# Patient Record
Sex: Male | Born: 1995
Health system: Southern US, Community
[De-identification: ages and names within clinical notes are randomized; demographics above are authoritative.]

## PROBLEM LIST (undated history)

## (undated) DIAGNOSIS — R238 Other skin changes: Secondary | ICD-10-CM

## (undated) DIAGNOSIS — M352 Behcet's disease: Principal | ICD-10-CM

## (undated) DIAGNOSIS — N5089 Other specified disorders of the male genital organs: Secondary | ICD-10-CM

## (undated) DIAGNOSIS — Z8719 Personal history of other diseases of the digestive system: Secondary | ICD-10-CM

## (undated) DIAGNOSIS — B009 Herpesviral infection, unspecified: Secondary | ICD-10-CM

## (undated) DIAGNOSIS — F329 Major depressive disorder, single episode, unspecified: Secondary | ICD-10-CM

## (undated) DIAGNOSIS — F32A Depression, unspecified: Secondary | ICD-10-CM

## (undated) DIAGNOSIS — R21 Rash and other nonspecific skin eruption: Secondary | ICD-10-CM

## (undated) DIAGNOSIS — R599 Enlarged lymph nodes, unspecified: Secondary | ICD-10-CM

## (undated) DIAGNOSIS — F909 Attention-deficit hyperactivity disorder, unspecified type: Secondary | ICD-10-CM

## (undated) HISTORY — DX: Personal history of other diseases of the digestive system: Z87.19

## (undated) HISTORY — DX: Herpesviral infection, unspecified: B00.9

## (undated) HISTORY — DX: Behcet's disease: M35.2

## (undated) HISTORY — DX: Depression, unspecified: F32.A

## (undated) HISTORY — DX: Other specified disorders of the male genital organs: N50.89

## (undated) HISTORY — DX: Other skin changes: R23.8

## (undated) HISTORY — DX: Rash and other nonspecific skin eruption: R21

## (undated) HISTORY — DX: Major depressive disorder, single episode, unspecified: F32.9

## (undated) HISTORY — DX: Attention-deficit hyperactivity disorder, unspecified type: F90.9

---
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
## 2015-02-19 ENCOUNTER — Telehealth: Payer: Self-pay

## 2015-02-19 NOTE — Telephone Encounter (Signed)
Waiting on payment of $10 for 12 pages.

## 2015-03-05 NOTE — Telephone Encounter (Signed)
[facility] had not [facility] our invoice for this patient, so I re-faxed the letter and will wait on payment before sending records.

## 2015-03-19 NOTE — Telephone Encounter (Signed)
refaxed letter for payment  03/19/15 °

## 2015-03-29 NOTE — Telephone Encounter (Signed)
Called and left a message requesting payment and asking if they still needed the records, will wait on call back before moving forward with the request.

## 2015-04-16 NOTE — Telephone Encounter (Signed)
[facility] they stated that they had settled with the patient and no longer need the records, so the release was cancelled.

## 2015-08-11 ENCOUNTER — Telehealth: Payer: Self-pay | Admitting: Family Medicine

## 2015-08-11 MED ORDER — VALACYCLOVIR HCL 1 G PO TABS: 1000.0000 mg | ORAL_TABLET | Freq: Three times a day (TID) | ORAL | Status: DC

## 2015-08-11 NOTE — Telephone Encounter (Signed)
Mother called answering service; patient with two day history of recurrent HSV-2 vesicular rash on forearm in same location as rash in 2015.  No refills on Valtrex.  [facility] Day weekend and office closed for the next 48 hours; mother requesting refill due to holiday weekend.  A/P: HSV2: [facility]

## 2015-08-12 ENCOUNTER — Encounter (HOSPITAL_COMMUNITY): Payer: Self-pay | Admitting: Nurse Practitioner

## 2015-08-12 ENCOUNTER — Emergency Department (HOSPITAL_COMMUNITY)
Admission: EM | Admit: 2015-08-12 | Discharge: 2015-08-12 | Disposition: A | Discharge status: Home / Self Care | Payer: Commercial Managed Care - HMO | Attending: Emergency Medicine | Admitting: Emergency Medicine

## 2015-08-12 DIAGNOSIS — R21 Rash and other nonspecific skin eruption: Secondary | ICD-10-CM

## 2015-08-12 DIAGNOSIS — Z8659 Personal history of other mental and behavioral disorders: Secondary | ICD-10-CM | POA: Insufficient documentation

## 2015-08-12 DIAGNOSIS — B009 Herpesviral infection, unspecified: Secondary | ICD-10-CM | POA: Insufficient documentation

## 2015-08-12 LAB — RAPID HIV SCREEN (HIV 1/2 AB+AG)
HIV 1/2 ANTIBODIES: NONREACTIVE
HIV-1 P24 ANTIGEN - HIV24: NONREACTIVE

## 2015-08-12 MED ORDER — IBUPROFEN 800 MG PO TABS
800.0000 mg | ORAL_TABLET | Freq: Once | ORAL | Status: AC
  Administered 2015-08-12: 800 mg via ORAL
  Filled 2015-08-12: qty 1

## 2015-08-12 MED ORDER — VALACYCLOVIR HCL 1 G PO TABS: 1000.0000 mg | ORAL_TABLET | Freq: Three times a day (TID) | ORAL | Status: DC

## 2015-08-12 MED ORDER — OXYCODONE-ACETAMINOPHEN 5-325 MG PO TABS: 1.0000 | ORAL_TABLET | ORAL | Status: DC | PRN

## 2015-08-12 MED ORDER — OXYCODONE-ACETAMINOPHEN 5-325 MG PO TABS
1.0000 | ORAL_TABLET | Freq: Once | ORAL | Status: AC
Start: 2015-08-12 — End: 2015-08-12
  Administered 2015-08-12: 1 via ORAL

## 2015-08-12 MED ORDER — IBUPROFEN 800 MG PO TABS: 800.0000 mg | ORAL_TABLET | Freq: Three times a day (TID) | ORAL | Status: DC | PRN

## 2015-08-12 MED ORDER — DIPHENHYDRAMINE HCL 25 MG PO CAPS
ORAL_CAPSULE | ORAL | Status: AC
  Filled 2015-08-12: qty 1

## 2015-08-12 MED ORDER — DIPHENHYDRAMINE HCL 25 MG PO CAPS
25.0000 mg | ORAL_CAPSULE | Freq: Once | ORAL | Status: AC
  Administered 2015-08-12: 25 mg via ORAL

## 2015-08-12 MED ORDER — OXYCODONE-ACETAMINOPHEN 5-325 MG PO TABS
ORAL_TABLET | ORAL | Status: AC
  Filled 2015-08-12: qty 1

## 2015-08-12 NOTE — ED Provider Notes (Signed)
CSN: [id number]     Arrival date & time 08/12/15  1114 History   First MD Initiated Contact with Patient 08/12/15 1213     Chief Complaint  Patient presents with  . Rash     (Consider location/radiation/quality/duration/timing/severity/associated sxs/prior Treatment) HPI Comments: 20 year old male with history of HSV skin infection in the past presents for rash. The patient reports that since Thursday he has developed a blisterlike rash over his left arm as well as his bilateral legs and his groin area. He reports he took some of his extra Valtrex that he had left over from his last infection by the rash seemed to get worse and he now has lesions on his lip. No lesions in his mouth. He denies fevers or chills. He said he has pain at the sites of the rash and no other symptoms at this time. No fever. He denies HIV or immunocompromise.    Patient is a 20 y.o. male presenting with rash.  Rash Associated symptoms: no abdominal pain, no diarrhea, no fatigue, no fever, no headaches, no myalgias, no nausea, no shortness of breath and not vomiting     Past Medical History  Diagnosis Date  . Lymph node enlargement   . Depression   . ADHD (attention deficit hyperactivity disorder)   . HSV-2 infection    History reviewed. No pertinent past surgical history. Family History  Problem Relation Age of Onset  . Diabetes Mother   . Hypertension Mother   . Cancer Other    Social History  Substance Use Topics  . Smoking status: Never Smoker   . Smokeless tobacco: None  . Alcohol Use: No    Review of Systems  Constitutional: Negative for fever, chills, appetite change and fatigue.  HENT: Negative for congestion, nosebleeds, postnasal drip, rhinorrhea and sinus pressure.   Eyes: Negative for visual disturbance.  Respiratory: Negative for cough, chest tightness and shortness of breath.   Cardiovascular: Negative for chest pain and palpitations.  Gastrointestinal: Negative for nausea, vomiting,  abdominal pain and diarrhea.  Genitourinary: Negative for dysuria, urgency and hematuria.  Musculoskeletal: Negative for myalgias and back pain.  Skin: Positive for rash.  Neurological: Negative for dizziness, weakness and headaches.  Hematological: Does not bruise/bleed easily.      Allergies  Review of patient's allergies indicates no known allergies.  Home Medications   Prior to Admission medications   Medication Sig Start Date End Date Taking? Authorizing Provider  cyclobenzaprine (FLEXERIL) 10 MG tablet Take 1 tablet (10 mg total) by mouth 3 (three) times daily as needed for muscle spasms. 12/08/14   Carmelina DaneJeffery S Anderson, MD  ibuprofen (ADVIL,MOTRIN) 800 MG tablet Take 1 tablet (800 mg total) by mouth every 8 (eight) hours as needed for moderate pain. 08/12/15   Leta BaptistEmily Roe Nguyen, MD  oxyCODONE-acetaminophen (PERCOCET/ROXICET) 5-325 MG tablet Take 1 tablet by mouth every 4 (four) hours as needed for moderate pain or severe pain. 08/12/15   Leta BaptistEmily Roe Nguyen, MD  valACYclovir (VALTREX) 1000 MG tablet Take 1 tablet (1,000 mg total) by mouth 3 (three) times daily. 08/12/15   Leta BaptistEmily Roe Nguyen, MD   BP 111/63 mmHg  Pulse 65  Temp(Src) 98.4 F (36.9 C) (Oral)  Resp 18  SpO2 100% Physical Exam  Constitutional: He is oriented to person, place, and time. He appears well-developed and well-nourished. No distress.  HENT:  Head: Normocephalic and atraumatic.  Right Ear: External ear normal.  Left Ear: External ear normal.  Mouth/Throat: Oropharynx is clear and moist.  No oropharyngeal exudate.  Eyes: EOM are normal. Pupils are equal, round, and reactive to light.  Neck: Normal range of motion. Neck supple.  Cardiovascular: Normal rate, regular rhythm, normal heart sounds and intact distal pulses.   No murmur heard. Pulmonary/Chest: Effort normal. No respiratory distress. He has no wheezes. He has no rales.  Abdominal: Soft. He exhibits no distension. There is no tenderness.  Musculoskeletal:  He exhibits no edema.  Neurological: He is alert and oriented to person, place, and time.  Skin: Skin is warm and dry. Rash noted. Rash is vesicular. He is not diaphoretic.     Vesicular lesions in the genital total area as well as a few scattered over the bilateral lower extremities. There were also vesicular lesions on the lips. No lesions on the inner mucosa of the mouth.  Vitals reviewed.   ED Course  Procedures (including critical care time) Labs Review Labs Reviewed  RAPID HIV SCREEN (HIV 1/2 AB+AG)  HERPES SIMPLEX VIRUS(HSV) DNA BY PCR    Imaging Review No results found. I have personally reviewed and evaluated these images and lab results as part of my medical decision-making.   EKG Interpretation None      MDM  Patient seen and evaluated in stable condition. Patient well-appearing other than rash. Rash consistent with HSV lesions. Patient denies immunocompromise.  Rapid HIV sent. Swabbed and sent for HSV from losing lesions on left arm. Patient discharged with prescriptions for Percocet, Motrin, Valtrex. Instructed to follow-up outpatient with infectious disease and primary care. He was given strict return precautions. As patient was well-appearing and this did not seem like disseminated zoster did not appear the patient would benefit from an patient treatment at this time unless his symptoms worsened. He and his mother expressed understanding and agreement with plan of care. Final diagnoses:  Rash  HSV infection    1. Rash, likely HSV infection    Leta Baptist, MD 08/12/15 1712

## 2015-08-12 NOTE — ED Notes (Addendum)
He c/o 3 day history painful rash to groin, lips, mouth, bilateral arms. He has hx of herpes rash to body that was similar to this, has been taking valcyclovir with no relief. He denies any fevers, body aches. He denies new medications. Blistered rash with scaly patches noted to L upper arm, blisters to lips.

## 2015-08-12 NOTE — Discharge Instructions (Signed)
You were evaluated today for your rash. This seems most consistent with an HSV infection or herpes infection. Please use the pain medication prescribed and take the antiviral medication prescribed. A culture of the wound was sent. Please follow-up with infectious disease and your primary care physician for further treatment and evaluation. Return for fever, systemic illness, headache, changes in vision.  Rash A rash is a change in the color or texture of the skin. There are many different types of rashes. You may have other problems that accompany your rash. CAUSES   Infections.  Allergic reactions. This can include allergies to pets or foods.  Certain medicines.  Exposure to certain chemicals, soaps, or cosmetics.  Heat.  Exposure to poisonous plants.  Tumors, both cancerous and noncancerous. SYMPTOMS   Redness.  Scaly skin.  Itchy skin.  Dry or cracked skin.  Bumps.  Blisters.  Pain. DIAGNOSIS  Your caregiver may do a physical exam to determine what type of rash you have. A skin sample (biopsy) may be taken and examined under a microscope. TREATMENT  Treatment depends on the type of rash you have. Your caregiver may prescribe certain medicines. For serious conditions, you may need to see a skin doctor (dermatologist). HOME CARE INSTRUCTIONS   Avoid the substance that caused your rash.  Do not scratch your rash. This can cause infection.  You may take cool baths to help stop itching.  Only take over-the-counter or prescription medicines as directed by your caregiver.  Keep all follow-up appointments as directed by your caregiver. SEEK IMMEDIATE MEDICAL CARE IF:  You have increasing pain, swelling, or redness.  You have a fever.  You have new or severe symptoms.  You have body aches, diarrhea, or vomiting.  Your rash is not better after 3 days. MAKE SURE YOU:  Understand these instructions.  Will watch your condition.  Will get help right away if you  are not doing well or get worse.   This information is not intended to replace advice given to you by your health care provider. Make sure you discuss any questions you have with your health care provider.   Document Released: 02/20/2002 Document Revised: 03/23/2014 Document Reviewed: 07/18/2014 Elsevier Interactive Patient Education [employer]2016 [employer]

## 2015-08-14 LAB — HERPES SIMPLEX VIRUS(HSV) DNA BY PCR
HSV 1 DNA: NEGATIVE
HSV 2 DNA: NEGATIVE

## 2015-08-18 ENCOUNTER — Emergency Department (HOSPITAL_COMMUNITY)
Admission: EM | Admit: 2015-08-18 | Discharge: 2015-08-18 | Disposition: A | Discharge status: Home / Self Care | Payer: Commercial Managed Care - HMO | Attending: Emergency Medicine | Admitting: Emergency Medicine

## 2015-08-18 ENCOUNTER — Encounter (HOSPITAL_COMMUNITY): Payer: Self-pay | Admitting: Emergency Medicine

## 2015-08-18 DIAGNOSIS — F329 Major depressive disorder, single episode, unspecified: Secondary | ICD-10-CM | POA: Insufficient documentation

## 2015-08-18 DIAGNOSIS — Z5321 Procedure and treatment not carried out due to patient leaving prior to being seen by health care provider: Secondary | ICD-10-CM | POA: Insufficient documentation

## 2015-08-18 DIAGNOSIS — R21 Rash and other nonspecific skin eruption: Secondary | ICD-10-CM | POA: Insufficient documentation

## 2015-08-18 DIAGNOSIS — F909 Attention-deficit hyperactivity disorder, unspecified type: Secondary | ICD-10-CM | POA: Insufficient documentation

## 2015-08-18 NOTE — ED Notes (Signed)
PA went into room, patient and belongings all gone. Will remove patient's name from trackboard
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
## 2015-08-18 NOTE — ED Notes (Signed)
Bed: WA08 Expected date:  Expected time:  Means of arrival:  Comments: EMS- rash, medication reaction

## 2015-08-18 NOTE — ED Notes (Signed)
Tech went to check on patient, patient not in room, will recheck shortly.
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
## 2015-09-24 ENCOUNTER — Encounter: Payer: Self-pay | Admitting: Infectious Disease

## 2015-09-24 ENCOUNTER — Ambulatory Visit (INDEPENDENT_AMBULATORY_CARE_PROVIDER_SITE_OTHER): Payer: Commercial Managed Care - HMO | Admitting: Infectious Disease

## 2015-09-24 VITALS — BP 130/68 | HR 69 | Temp 98.1°F | Ht 77.0 in | Wt 150.0 lb

## 2015-09-24 DIAGNOSIS — R21 Rash and other nonspecific skin eruption: Secondary | ICD-10-CM

## 2015-09-24 DIAGNOSIS — M352 Behcet's disease: Secondary | ICD-10-CM | POA: Diagnosis not present

## 2015-09-24 DIAGNOSIS — R238 Other skin changes: Secondary | ICD-10-CM | POA: Insufficient documentation

## 2015-09-24 DIAGNOSIS — N5089 Other specified disorders of the male genital organs: Secondary | ICD-10-CM | POA: Diagnosis not present

## 2015-09-24 DIAGNOSIS — Z8719 Personal history of other diseases of the digestive system: Secondary | ICD-10-CM | POA: Insufficient documentation

## 2015-09-24 HISTORY — DX: Rash and other nonspecific skin eruption: R21

## 2015-09-24 HISTORY — DX: Personal history of other diseases of the digestive system: Z87.19

## 2015-09-24 HISTORY — DX: Behcet's disease: M35.2

## 2015-09-24 HISTORY — DX: Other specified disorders of the male genital organs: N50.89

## 2015-09-24 HISTORY — DX: Other skin changes: R23.8
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
## 2015-09-25 LAB — COMPLETE METABOLIC PANEL WITH GFR
ALK PHOS: 40 U/L — AB (ref 48–230)
ALT: 12 U/L (ref 8–46)
AST: 18 U/L (ref 12–32)
Albumin: 4.2 g/dL (ref 3.6–5.1)
BUN: 9 mg/dL (ref 7–20)
CALCIUM: 9.3 mg/dL (ref 8.9–10.4)
CO2: 23 mmol/L (ref 20–31)
Chloride: 104 mmol/L (ref 98–110)
Creat: 0.85 mg/dL (ref 0.60–1.26)
GFR, Est African American: >89 mL/min (ref >=60)
GFR, Est Non African American: >89 mL/min (ref >=60)
GLUCOSE: 82 mg/dL (ref 65–99)
POTASSIUM: 4.2 mmol/L (ref 3.8–5.1)
Sodium: 140 mmol/L (ref 135–146)
Total Bilirubin: 0.9 mg/dL (ref 0.2–1.1)
Total Protein: 6.6 g/dL (ref 6.3–8.2)

## 2015-09-25 LAB — CBC WITH DIFFERENTIAL/PLATELET
BASOS PCT: 0 %
Basophils Absolute: 0 cells/uL (ref 0–200)
EOS PCT: 1 %
Eosinophils Absolute: 49 cells/uL (ref 15–500)
HEMATOCRIT: 47.2 % (ref 38.5–50.0)
Hemoglobin: 15.8 g/dL (ref 13.2–17.1)
LYMPHS PCT: 26 %
Lymphs Abs: 1274 cells/uL (ref 850–3900)
MCH: 30.2 pg (ref 27.0–33.0)
MCHC: 33.5 g/dL (ref 32.0–36.0)
MCV: 90.1 fL (ref 80.0–100.0)
MONO ABS: 343 {cells}/uL (ref 200–950)
MPV: 10.7 fL (ref 7.5–12.5)
Monocytes Relative: 7 %
Neutro Abs: 3234 cells/uL (ref 1500–7800)
Neutrophils Relative %: 66 %
Platelets: 293 10*3/uL (ref 140–400)
RBC: 5.24 MIL/uL (ref 4.20–5.80)
RDW: 14.5 % (ref 11.0–15.0)
WBC: 4.9 10*3/uL (ref 3.8–10.8)

## 2015-09-25 LAB — HEPATITIS PANEL, ACUTE
HCV Ab: NEGATIVE
HEP B S AG: NEGATIVE
Hep A IgM: NONREACTIVE
Hep B C IgM: NONREACTIVE

## 2015-09-25 LAB — SEDIMENTATION RATE: Sed Rate: 1 mm/hr (ref 0–15)

## 2015-09-25 LAB — C-REACTIVE PROTEIN

## 2015-09-25 LAB — ANA: ANA: NEGATIVE

## 2015-09-25 LAB — RPR

## 2015-09-26 LAB — URINE CYTOLOGY ANCILLARY ONLY
CHLAMYDIA, DNA PROBE: NEGATIVE
Neisseria Gonorrhea: NEGATIVE

## 2015-09-27 LAB — HSV(HERPES SIMPLEX VRS) I + II AB-IGG
HSV 1 Glycoprotein G Ab, IgG: 1.2 Index — ABNORMAL HIGH (ref <0.90)
HSV 2 Glycoprotein G Ab, IgG: 9.66 Index — ABNORMAL HIGH (ref <0.90)

## 2015-12-09 ENCOUNTER — Ambulatory Visit: Payer: 59 | Admitting: Infectious Disease

## 2015-12-10 ENCOUNTER — Ambulatory Visit: Payer: 59 | Admitting: Infectious Disease

## 2017-07-30 ENCOUNTER — Encounter: Payer: Self-pay | Admitting: Family Medicine

## 2017-08-04 ENCOUNTER — Encounter: Payer: Self-pay | Admitting: Family Medicine

## 2018-03-30 ENCOUNTER — Ambulatory Visit: Payer: 59 | Admitting: Psychology

## 2018-04-06 ENCOUNTER — Ambulatory Visit: Payer: Self-pay | Admitting: Psychology

## 2018-04-08 ENCOUNTER — Ambulatory Visit (INDEPENDENT_AMBULATORY_CARE_PROVIDER_SITE_OTHER): Payer: 59 | Admitting: Psychology

## 2018-04-08 DIAGNOSIS — F319 Bipolar disorder, unspecified: Secondary | ICD-10-CM

## 2018-04-14 ENCOUNTER — Ambulatory Visit (INDEPENDENT_AMBULATORY_CARE_PROVIDER_SITE_OTHER): Payer: 59 | Admitting: Psychology

## 2018-04-14 DIAGNOSIS — F319 Bipolar disorder, unspecified: Secondary | ICD-10-CM

## 2018-04-26 ENCOUNTER — Ambulatory Visit (INDEPENDENT_AMBULATORY_CARE_PROVIDER_SITE_OTHER): Payer: 59 | Admitting: Psychology

## 2018-04-26 DIAGNOSIS — F319 Bipolar disorder, unspecified: Secondary | ICD-10-CM | POA: Diagnosis not present

## 2018-05-11 ENCOUNTER — Ambulatory Visit: Payer: Self-pay | Admitting: Psychology

## 2018-05-13 ENCOUNTER — Other Ambulatory Visit: Payer: Self-pay

## 2018-05-13 ENCOUNTER — Encounter (HOSPITAL_COMMUNITY): Payer: Self-pay | Admitting: *Deleted

## 2018-05-13 ENCOUNTER — Ambulatory Visit (HOSPITAL_COMMUNITY)
Admission: EM | Admit: 2018-05-13 | Discharge: 2018-05-13 | Disposition: A | Discharge status: Home / Self Care | Payer: 59 | Attending: Physician Assistant | Admitting: Physician Assistant

## 2018-05-13 ENCOUNTER — Ambulatory Visit (INDEPENDENT_AMBULATORY_CARE_PROVIDER_SITE_OTHER): Payer: 59

## 2018-05-13 DIAGNOSIS — M79642 Pain in left hand: Secondary | ICD-10-CM | POA: Diagnosis not present

## 2018-05-13 DIAGNOSIS — T148XXA Other injury of unspecified body region, initial encounter: Secondary | ICD-10-CM

## 2018-05-13 MED ORDER — MUPIROCIN 2 % EX OINT: 1.0000 "application " | TOPICAL_OINTMENT | Freq: Two times a day (BID) | CUTANEOUS | 0 refills | Status: AC

## 2018-05-13 MED ORDER — MELOXICAM 7.5 MG PO TABS: 7.5000 mg | ORAL_TABLET | Freq: Every day | ORAL | 0 refills | Status: AC

## 2018-05-13 NOTE — ED Provider Notes (Signed)
[facility]    CSN: [id number] Arrival date & time: 05/13/18  1005     History   Chief Complaint Chief Complaint  Patient presents with  . Hand Pain    HPI Alexander Romero is a 23 y.o. male.   23 year old male who is left hand dominant comes in for left hand injury at work. States he was at work, when a 40lb object fell onto his left hand, hitting the 2nd and 3rd PIP joints. He has swelling to the 5th MCP joint. States pain to the whole hand, with numbness/tingling of the 2nd/3rd finger and burning sensation with ROM. He has not taken anything for the symptoms.      Past Medical History:  Diagnosis Date  . ADHD (attention deficit hyperactivity disorder)   . Behcet recurrent disease ([facility]) 09/24/2015  . Depression   . Genital lesion, male 09/24/2015  . History of oral lesions 09/24/2015  . HSV-2 infection   . Lymph node enlargement   . Macular rash 09/24/2015  . Vesicular rash 09/24/2015    Patient Active Problem List   Diagnosis Date Noted  . Behcet recurrent disease ([facility]) 09/24/2015  . Vesicular rash 09/24/2015  . Macular rash 09/24/2015  . History of oral lesions 09/24/2015  . Genital lesion, male 09/24/2015  . Contusion of right hip 11/17/2012  . Facial laceration 11/17/2012  . Pelvic fluid collection 11/17/2012  . Fracture of occipital condyle ([facility]) 11/17/2012    History reviewed. No pertinent surgical history.     Home Medications    Prior to Admission medications   Medication Sig Start Date End Date Taking? Authorizing Provider  meloxicam (MOBIC) 7.5 MG tablet Take 1 tablet (7.5 mg total) by mouth daily. 05/13/18   Cathie Hoops,  V, PA-C  mupirocin ointment (BACTROBAN) 2 % Apply 1 application topically 2 (two) times daily. 05/13/18   Belinda Fisher, PA-C    Family History Family History  Problem Relation Age of Onset  . Diabetes Mother   . Hypertension Mother   . Cancer Other     Social History Social History   Tobacco Use  . Smoking status: Never  Smoker  . Smokeless tobacco: Never Used  Substance Use Topics  . Alcohol use: No    Alcohol/week: 0.0 standard drinks  . Drug use: No     Allergies   Patient has no known allergies.   Review of Systems Review of Systems  Reason unable to perform ROS: See HPI as above.     Physical Exam Triage Vital Signs ED Triage Vitals [05/13/18 1120]  Enc Vitals Group     BP (!) 150/104     Pulse Rate 66     Resp 16     Temp 98.5 F (36.9 C)     Temp Source Temporal     SpO2 100 %     Weight      Height      Head Circumference      Peak Flow      Pain Score 9     Pain Loc      Pain Edu?      Excl. in GC?    No data found.  Updated Vital Signs BP (!) 150/104 (BP Location: Right Arm)   Pulse 66   Temp 98.5 F (36.9 C) (Temporal)   Resp 16   SpO2 100%   Physical Exam Constitutional:      General: He is not in acute distress.  Appearance: He is well-developed. He is not diaphoretic.  HENT:     Head: Normocephalic and atraumatic.  Eyes:     Conjunctiva/sclera: Conjunctivae normal.     Pupils: Pupils are equal, round, and reactive to light.  Musculoskeletal:     Comments: Abrasion to the PIP joint of 2nd and 3rd digit of dorsal left hand. Surrounding swelling to the area. Swelling to the left 5th MCP joint. Tenderness to palpation of distal MCPs. Tenderness to palpation along PIP joint of 2nd and 3rd digit. Full ROM. Strength deferred. Sensation 4/5 of 2nd and 3rd digit. Radial pulse 2+, cap refill <2s  Neurological:     Mental Status: He is alert and oriented to person, place, and time.      UC Treatments / Results  Labs (all labs ordered are listed, but only abnormal results are displayed) Labs Reviewed - No data to display  EKG None  Radiology Dg Hand Complete Left  Result Date: 05/13/2018 CLINICAL DATA:  Per pt: dropped a 45 lb weight onto the left hand this morning at work. Pain is the left hand, scaphoid aspect, transversely across the mid metacarpals,  index and middle fingers proximally, slight bleeding proximally. No prior injury to the left hand. Patient is not a diabetic. EXAM: LEFT HAND - COMPLETE 3+ VIEW COMPARISON:  None. FINDINGS: No fracture or bone lesion. Joints are normally spaced and aligned. Soft tissues are unremarkable. IMPRESSION: Negative. Electronically Signed   By: Amie Portland M.D.   On: 05/13/2018 12:39    Procedures Procedures (including critical care time)  Medications Ordered in UC Medications - No data to display  Initial Impression / Assessment and Plan / UC Course  I have reviewed the triage vital signs and the nursing notes.  Pertinent labs & imaging results that were available during my care of the patient were reviewed by me and considered in my medical decision making (see chart for details).    Xray negative for fracture/dislocation. NSAIDs, ice compress, rest, finger splint. Wound care instructions given. Patient to follow up with occupational health for recheck and referral needed. Return precautions given.  Final Clinical Impressions(s) / UC Diagnoses   Final diagnoses:  Left hand pain  Skin abrasion    ED Prescriptions    Medication Sig Dispense Auth. Provider   meloxicam (MOBIC) 7.5 MG tablet Take 1 tablet (7.5 mg total) by mouth daily. 15 tablet ,  V, PA-C   mupirocin ointment (BACTROBAN) 2 % Apply 1 application topically 2 (two) times daily. 22 g Threasa Alpha, [place] 05/13/18 1306

## 2018-05-13 NOTE — Discharge Instructions (Signed)
X-ray negative for fracture or dislocation. Start Mobic. Do not take ibuprofen (motrin/advil)/ naproxen (aleve) while on mobic.  Daily dressings on the wound, clean gently with soap and water, dressed with Bactroban for the first 2 3 days.  Do not soak wound in water.  Monitor for spreading redness, increased warmth, fever.  Follow-up with occupational health on Monday for reevaluation needed.

## 2018-05-13 NOTE — ED Notes (Signed)
Pts left two fingers were cleaned with Saf-Clens and warm water, then Bacitracin was applied with 1" gauze and coband.  Pt tolerated it well and was sent home with extra coband.

## 2018-05-13 NOTE — ED Triage Notes (Signed)
States he dropped a weigh on his left hand at work today

## 2019-09-19 ENCOUNTER — Ambulatory Visit (HOSPITAL_COMMUNITY)
Admission: EM | Admit: 2019-09-19 | Discharge: 2019-09-19 | Disposition: A | Discharge status: Home / Self Care | Payer: 59 | Attending: Family Medicine | Admitting: Family Medicine

## 2019-09-19 ENCOUNTER — Other Ambulatory Visit: Payer: Self-pay

## 2019-09-19 ENCOUNTER — Encounter (HOSPITAL_COMMUNITY): Payer: Self-pay | Admitting: Emergency Medicine

## 2019-09-19 DIAGNOSIS — R220 Localized swelling, mass and lump, head: Secondary | ICD-10-CM | POA: Diagnosis not present

## 2019-09-19 MED ORDER — FLUORESCEIN SODIUM 1 MG OP STRP
ORAL_STRIP | OPHTHALMIC | Status: AC
  Filled 2019-09-19: qty 1

## 2019-09-19 MED ORDER — PREDNISONE 10 MG PO TABS
40.0000 mg | ORAL_TABLET | Freq: Every day | ORAL | 0 refills | Status: AC
Start: 2019-09-19 — End: 2019-09-22

## 2019-09-19 MED ORDER — FLUTICASONE PROPIONATE 50 MCG/ACT NA SUSP: 1.0000 | Freq: Every day | NASAL | 0 refills | Status: AC

## 2019-09-19 MED ORDER — CETIRIZINE HCL 10 MG PO TABS: 10.0000 mg | ORAL_TABLET | Freq: Every day | ORAL | 0 refills | Status: AC

## 2019-09-19 NOTE — Discharge Instructions (Addendum)
Continue to apply cool compresses  Take the prednisone for 3 days Use flonase daily Take zyrtec daily  If not improved or worsening over the next 2 days please return for re-evaluation  Call the family medicine center to establish care with a primary care provider

## 2019-09-19 NOTE — ED Provider Notes (Addendum)
[facility]    CSN: [id number] Arrival date & time: 09/19/19  4132      History   Chief Complaint Chief Complaint  Patient presents with  . Eye Problem    HPI Alexander Romero is a 24 y.o. male.   Patient presents for evaluation of right-sided facial and lower eye swelling.  He reports this started Saturday evening which was 2 to 3 days ago.  He reports it was smaller and more along the inside of his nose.  Reports since then the swelling has increased somewhat.  He wonders if he had something in his eye yesterday at work is some dust got over his safety glasses.  He denies any blurry vision, eye pain or photophobia.  He reports the swelling has not been painful at all.  He does report some itching early on a little bit of watery eyes with cool compress.  He has been trying cool compresses and thought maybe this helped a little bit.  He has not tried other medications.  He has not had any sneezing.  He has not had any trauma.  No other eye injuries.  Does not wear contacts or glasses.  He has not had much nasal drainage or discharge.  No recent fevers or chills.     Past Medical History:  Diagnosis Date  . ADHD (attention deficit hyperactivity disorder)   . Behcet recurrent disease ([facility]) 09/24/2015  . Depression   . Genital lesion, male 09/24/2015  . History of oral lesions 09/24/2015  . HSV-2 infection   . Lymph node enlargement   . Macular rash 09/24/2015  . Vesicular rash 09/24/2015    Patient Active Problem List   Diagnosis Date Noted  . Behcet recurrent disease ([facility]) 09/24/2015  . Vesicular rash 09/24/2015  . Macular rash 09/24/2015  . History of oral lesions 09/24/2015  . Genital lesion, male 09/24/2015  . Contusion of right hip 11/17/2012  . Facial laceration 11/17/2012  . Pelvic fluid collection 11/17/2012  . Fracture of occipital condyle ([facility]) 11/17/2012    History reviewed. No pertinent surgical history.     Home Medications    Prior to Admission  medications   Medication Sig Start Date End Date Taking? Authorizing Provider  cetirizine (ZYRTEC ALLERGY) 10 MG tablet Take 1 tablet (10 mg total) by mouth daily. 09/19/19   Alyzae Hawkey, Veryl Speak, PA-C  fluticasone (FLONASE) 50 MCG/ACT nasal spray Place 1 spray into both nostrils daily. 09/19/19   Verner Mccrone, Veryl Speak, PA-C  meloxicam (MOBIC) 7.5 MG tablet Take 1 tablet (7.5 mg total) by mouth daily. 05/13/18   Cathie Hoops, Amy V, PA-C  mupirocin ointment (BACTROBAN) 2 % Apply 1 application topically 2 (two) times daily. 05/13/18   Cathie Hoops, Amy V, PA-C  predniSONE (DELTASONE) 10 MG tablet Take 4 tablets (40 mg total) by mouth daily for 3 days. 09/19/19 09/22/19  Holiday Mcmenamin, Veryl Speak, PA-C    Family History Family History  Problem Relation Age of Onset  . Diabetes Mother   . Hypertension Mother   . Cancer Other     Social History Social History   Tobacco Use  . Smoking status: Never Smoker  . Smokeless tobacco: Never Used  Substance Use Topics  . Alcohol use: No    Alcohol/week: 0.0 standard drinks  . Drug use: No     Allergies   Patient has no known allergies.   Review of Systems Review of Systems   Physical Exam Triage Vital Signs ED Triage Vitals  Enc  Vitals Group     BP 09/19/19 1015 109/66     Pulse Rate 09/19/19 1013 (!) 58     Resp 09/19/19 1013 16     Temp 09/19/19 1013 98.4 F (36.9 C)     Temp Source 09/19/19 1013 Oral     SpO2 09/19/19 1013 100 %     Weight --      Height --      Head Circumference --      Peak Flow --      Pain Score 09/19/19 1012 0     Pain Loc --      Pain Edu? --      Excl. in GC? --    No data found.  Updated Vital Signs BP 109/66   Pulse (!) 58   Temp 98.4 F (36.9 C) (Oral)   Resp 16   SpO2 100%   Visual Acuity Right Eye Distance: 20/50 Left Eye Distance: 20/40 Bilateral Distance:    Right Eye Near:   Left Eye Near:    Bilateral Near:     Physical Exam Vitals and nursing note reviewed.  Constitutional:      Appearance: He is well-developed.  HENT:      Head: Normocephalic and atraumatic.     Nose:     Comments: Some turbinate swelling on the right side, no large amounts of discharge.    Mouth/Throat:     Mouth: Mucous membranes are moist.     Comments: No maxillary dentition tenderness or suspicious teeth. Eyes:     Conjunctiva/sclera: Conjunctivae normal.     Comments: Right eye without scleral injection, discharge, photophobia.  Extraocular movements intact.  Vision grossly intact, visual acuity below.  Fluorescein without uptake.  There is swelling just below the right eye, no superior lid involvement.  There is also swelling of the right maxillary region.  Nontender, nonerythematous.  Nonwarm.  Cardiovascular:     Rate and Rhythm: Normal rate.     Heart sounds: No murmur heard.   Pulmonary:     Effort: Pulmonary effort is normal. No respiratory distress.  Musculoskeletal:     Cervical back: Neck supple.  Skin:    General: Skin is warm and dry.     Findings: No rash.  Neurological:     Mental Status: He is alert.      UC Treatments / Results  Labs (all labs ordered are listed, but only abnormal results are displayed) Labs Reviewed - No data to display  EKG   Radiology No results found.  Procedures Procedures (including critical care time)  Medications Ordered in UC Medications - No data to display  Initial Impression / Assessment and Plan / UC Course  I have reviewed the triage vital signs and the nursing notes.  Pertinent labs & imaging results that were available during my care of the patient were reviewed by me and considered in my medical decision making (see chart for details).     #Facial swelling Patient is a 24 year old presenting with facial swelling and lower eyelid swelling.  No obvious signs of infection or eye trauma, as there is no pain, erythema or discharge..  Swelling seems more superficial and exam is reassuring for no deep orbital involvement.  Is more consistent with allergy.  Case  discussed and patient examined with attending physician Dr. Delton See, and we concur this is more likely local allergic response.  We will try a short course of prednisone, Flonase and Zyrtec.  Strict return precautions  were discussed with the patient and he verbalized understanding these.  Also recommended he establish with a family medicine center, resource given. Final Clinical Impressions(s) / UC Diagnoses   Final diagnoses:  Facial swelling     Discharge Instructions     Continue to apply cool compresses  Take the prednisone for 3 days Use flonase daily Take zyrtec daily  If not improved or worsening over the next 2 days please return for re-evaluation  Call the family medicine center to establish care with a primary care provider      ED Prescriptions    Medication Sig Dispense Auth. Provider   predniSONE (DELTASONE) 10 MG tablet Take 4 tablets (40 mg total) by mouth daily for 3 days. 12 tablet Abhiram Criado, Veryl Speak, PA-C   cetirizine (ZYRTEC ALLERGY) 10 MG tablet Take 1 tablet (10 mg total) by mouth daily. 30 tablet Mckinleigh Schuchart, Veryl Speak, PA-C   fluticasone (FLONASE) 50 MCG/ACT nasal spray Place 1 spray into both nostrils daily. 11.1 mL Vienne Corcoran, Veryl Speak, PA-C     PDMP not reviewed this encounter.   Hermelinda Medicus, PA-C 09/19/19 1122    Etter Royall, Veryl Speak, PA-C 09/19/19 2354

## 2019-09-19 NOTE — ED Triage Notes (Signed)
Right eye swelling started 2 days ago. Not painful. PT uses a drill daily at work and was not wearing safety glasses 2 days ago when dust from the drill fell into his face.

## 2021-07-26 ENCOUNTER — Ambulatory Visit
Admission: EM | Admit: 2021-07-26 | Discharge: 2021-07-26 | Disposition: A | Discharge status: Home / Self Care | Payer: 59 | Attending: Urgent Care | Admitting: Urgent Care

## 2021-07-26 ENCOUNTER — Encounter: Payer: Self-pay | Admitting: Emergency Medicine

## 2021-07-26 DIAGNOSIS — Z23 Encounter for immunization: Secondary | ICD-10-CM

## 2021-07-26 DIAGNOSIS — S91331A Puncture wound without foreign body, right foot, initial encounter: Secondary | ICD-10-CM

## 2021-07-26 DIAGNOSIS — M79671 Pain in right foot: Secondary | ICD-10-CM

## 2021-07-26 MED ORDER — TETANUS-DIPHTH-ACELL PERTUSSIS 5-2.5-18.5 LF-MCG/0.5 IM SUSY
0.5000 mL | PREFILLED_SYRINGE | Freq: Once | INTRAMUSCULAR | Status: AC
  Administered 2021-07-26: 0.5 mL via INTRAMUSCULAR

## 2021-07-26 MED ORDER — NAPROXEN 500 MG PO TABS: 500.0000 mg | ORAL_TABLET | Freq: Two times a day (BID) | ORAL | 0 refills | Status: AC

## 2021-07-26 NOTE — ED Provider Notes (Signed)
?  [facility] ? ? ?MRN: [id number] DOB: 12-03-1995 ? ?Subjective:  ? ?Alexander Romero is a 26 y.o. male presenting for 1 day history of persistent right foot pain.  Patient suffered a puncture wound to the right plantar surface of his foot.  States that he removed the nail entirely.  Has kept the wound clean and covered.  Needs to have his Tdap updated. ? ?No chronic medications. ? ?No Known Allergies ? ?Past Medical History:  ?Diagnosis Date  ? ADHD (attention deficit hyperactivity disorder)   ? Behcet recurrent disease (HCC) 09/24/2015  ? Depression   ? Genital lesion, male 09/24/2015  ? History of oral lesions 09/24/2015  ? HSV-2 infection   ? Lymph node enlargement   ? Macular rash 09/24/2015  ? Vesicular rash 09/24/2015  ?  ? ?History reviewed. No pertinent surgical history. ? ?Family History  ?Problem Relation Age of Onset  ? Diabetes Mother   ? Hypertension Mother   ? Cancer Other   ? ? ?Social History  ? ?Tobacco Use  ? Smoking status: Never  ? Smokeless tobacco: Never  ?Substance Use Topics  ? Alcohol use: No  ?  Alcohol/week: 0.0 standard drinks  ? Drug use: No  ? ? ?ROS ? ? ?Objective:  ? ?Vitals: ?BP 125/72   Pulse 76   Temp 98.1 ?F (36.7 ?C)   Resp 20   SpO2 98%  ? ?Physical Exam ?Constitutional:   ?   General: He is not in acute distress. ?   Appearance: Normal appearance. He is well-developed and normal weight. He is not ill-appearing, toxic-appearing or diaphoretic.  ?HENT:  ?   Head: Normocephalic and atraumatic.  ?   Right Ear: External ear normal.  ?   Left Ear: External ear normal.  ?   Nose: Nose normal.  ?   Mouth/Throat:  ?   Pharynx: Oropharynx is clear.  ?Eyes:  ?   General: No scleral icterus.    ?   Right eye: No discharge.     ?   Left eye: No discharge.  ?   Extraocular Movements: Extraocular movements intact.  ?Cardiovascular:  ?   Rate and Rhythm: Normal rate.  ?Pulmonary:  ?   Effort: Pulmonary effort is normal.  ?Musculoskeletal:  ?   Cervical back: Normal range  of motion.  ?     Feet: ? ?Neurological:  ?   Mental Status: He is alert and oriented to person, place, and time.  ?Psychiatric:     ?   Mood and Affect: Mood normal.     ?   Behavior: Behavior normal.     ?   Thought Content: Thought content normal.     ?   Judgment: Judgment normal.  ? ? ? ? ? ?Assessment and Plan :  ? ?PDMP not reviewed this encounter. ? ?1. Puncture wound of right foot, initial encounter   ?2. Need for diphtheria-tetanus-pertussis (Tdap) vaccine   ?3. Right foot pain   ? ?A new dressing was applied to the wound.  Will defer antibiotic use for now.  Recommended naproxen for pain and inflammation.  Discussed signs of wound infection.  Tdap was updated.  Follow-up with occupational health if patient requires more time from work or work restrictions. Counseled patient on potential for adverse effects with medications prescribed/recommended today, ER and return-to-clinic precautions discussed, patient verbalized understanding. ? ?  ?Wallis Bamberg, PA-C ?07/26/21 1016 ? ?

## 2021-07-26 NOTE — ED Triage Notes (Signed)
Pt here with puncture wound from nail to right foot yesterday.  ?

## 2022-08-15 ENCOUNTER — Encounter (HOSPITAL_COMMUNITY): Payer: Self-pay | Admitting: Emergency Medicine

## 2022-08-15 ENCOUNTER — Other Ambulatory Visit: Payer: Self-pay

## 2022-08-15 ENCOUNTER — Emergency Department (HOSPITAL_COMMUNITY): Payer: Self-pay

## 2022-08-15 ENCOUNTER — Emergency Department (HOSPITAL_COMMUNITY)
Admission: EM | Admit: 2022-08-15 | Discharge: 2022-08-15 | Disposition: A | Discharge status: Home / Self Care | Payer: Self-pay | Attending: Emergency Medicine | Admitting: Emergency Medicine

## 2022-08-15 DIAGNOSIS — R569 Unspecified convulsions: Secondary | ICD-10-CM | POA: Insufficient documentation

## 2022-08-15 LAB — CBC WITH DIFFERENTIAL/PLATELET
Abs Immature Granulocytes: 0.04 10*3/uL (ref 0.00–0.07)
Basophils Absolute: 0.1 10*3/uL (ref 0.0–0.1)
Basophils Relative: 1 %
Eosinophils Absolute: 0.1 10*3/uL (ref 0.0–0.5)
Eosinophils Relative: 1 %
HCT: 41.2 % (ref 39.0–52.0)
Hemoglobin: 13.7 g/dL (ref 13.0–17.0)
Immature Granulocytes: 1 %
Lymphocytes Relative: 14 %
Lymphs Abs: 0.9 10*3/uL (ref 0.7–4.0)
MCH: 30.6 pg (ref 26.0–34.0)
MCHC: 33.3 g/dL (ref 30.0–36.0)
MCV: 92 fL (ref 80.0–100.0)
Monocytes Absolute: 0.3 10*3/uL (ref 0.1–1.0)
Monocytes Relative: 5 %
Neutro Abs: 5 10*3/uL (ref 1.7–7.7)
Neutrophils Relative %: 78 %
Platelets: 205 10*3/uL (ref 150–400)
RBC: 4.48 MIL/uL (ref 4.22–5.81)
RDW: 13.3 % (ref 11.5–15.5)
WBC: 6.4 10*3/uL (ref 4.0–10.5)
nRBC: 0 % (ref 0.0–0.2)

## 2022-08-15 LAB — COMPREHENSIVE METABOLIC PANEL
ALT: 14 U/L (ref 0–44)
AST: 27 U/L (ref 15–41)
Albumin: 4.2 g/dL (ref 3.5–5.0)
Alkaline Phosphatase: 49 U/L (ref 38–126)
Anion gap: 12 (ref 5–15)
BUN: 7 mg/dL (ref 6–20)
CO2: 19 mmol/L — ABNORMAL LOW (ref 22–32)
Calcium: 9 mg/dL (ref 8.9–10.3)
Chloride: 98 mmol/L (ref 98–111)
Creatinine, Ser: 1.15 mg/dL (ref 0.61–1.24)
GFR, Estimated: >60 mL/min (ref >60)
Glucose, Bld: 81 mg/dL (ref 70–99)
Potassium: 3.7 mmol/L (ref 3.5–5.1)
Sodium: 129 mmol/L — ABNORMAL LOW (ref 135–145)
Total Bilirubin: 0.7 mg/dL (ref 0.3–1.2)
Total Protein: 7.1 g/dL (ref 6.5–8.1)

## 2022-08-15 LAB — RAPID URINE DRUG SCREEN, HOSP PERFORMED
Amphetamines: POSITIVE — AB
Barbiturates: NOT DETECTED
Benzodiazepines: NOT DETECTED
Cocaine: NOT DETECTED
Opiates: NOT DETECTED
Tetrahydrocannabinol: POSITIVE — AB

## 2022-08-15 LAB — CBG MONITORING, ED: Glucose-Capillary: 84 mg/dL (ref 70–99)

## 2022-08-15 LAB — MAGNESIUM: Magnesium: 2.4 mg/dL (ref 1.7–2.4)

## 2022-08-15 LAB — ETHANOL: Alcohol, Ethyl (B): <10 mg/dL (ref <10)

## 2022-08-15 NOTE — ED Provider Notes (Signed)
EMERGENCY DEPARTMENT AT [facility] Provider Note   CSN: [id number] Arrival date & time: 08/15/22  2032     History  Chief Complaint  Patient presents with   Seizures    Alexander Romero is a 27 y.o. male.   Seizures  This patient is a 27 year old male, he denies any significant chronic medical problems, evidently the patient had possibly had a seizure when he was a young child but has not been diagnosed with a seizure disorder.  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  He was laying supine on the ground, he was frothing at the mouth and unresponsive, he was postictal and had urinary incontinence.  The patient is now confused but coming around.  He states he has had a headache for the last couple of hours but has no other history or concerns or questions and cannot answer anything about what happened tonight as he has no memory of this.   Home Medications Prior to Admission medications   Medication Sig Start Date End Date Taking? Authorizing Provider  cetirizine (ZYRTEC ALLERGY) 10 MG tablet Take 1 tablet (10 mg total) by mouth daily. 09/19/19   Darr, Gerilyn Pilgrim, PA-C  fluticasone (FLONASE) 50 MCG/ACT nasal spray Place 1 spray into both nostrils daily. 09/19/19   Darr, Gerilyn Pilgrim, PA-C  meloxicam (MOBIC) 7.5 MG tablet Take 1 tablet (7.5 mg total) by mouth daily. 05/13/18   Cathie Hoops, Amy V, PA-C  mupirocin ointment (BACTROBAN) 2 % Apply 1 application topically 2 (two) times daily. 05/13/18   Cathie Hoops, Amy V, PA-C  naproxen (NAPROSYN) 500 MG tablet Take 1 tablet (500 mg total) by mouth 2 (two) times daily with a meal. 07/26/21   Wallis Bamberg, PA-C      Allergies    Patient has no known allergies.    Review of Systems   Review of Systems  Unable to perform ROS: Mental status change  Neurological:  Positive for seizures.    Physical Exam Updated Vital Signs BP 124/70 (BP Location: Right Arm)   Pulse 94   Temp (!) 97.3 F (36.3 C) (Oral)    Resp 11   Ht 1.905 m (6\' 3" )   Wt 61.2 kg   SpO2 98%   BMI 16.87 kg/m  Physical Exam Vitals and nursing note reviewed.  Constitutional:      General: He is not in acute distress.    Appearance: He is well-developed.  HENT:     Head: Normocephalic and atraumatic.     Mouth/Throat:     Pharynx: No oropharyngeal exudate.     Comments: No evidence of tongue biting or dental damage Eyes:     General: No scleral icterus.       Right eye: No discharge.        Left eye: No discharge.     Conjunctiva/sclera: Conjunctivae normal.     Pupils: Pupils are equal, round, and reactive to light.  Neck:     Thyroid: No thyromegaly.     Vascular: No JVD.  Cardiovascular:     Rate and Rhythm: Normal rate and regular rhythm.     Heart sounds: Normal heart sounds. No murmur heard.    No friction rub. No gallop.  Pulmonary:     Effort: Pulmonary effort is normal. No respiratory distress.     Breath sounds: Normal breath sounds. No wheezing or rales.  Abdominal:     General: Bowel sounds are  normal. There is no distension.     Palpations: Abdomen is soft. There is no mass.     Tenderness: There is no abdominal tenderness.  Genitourinary:    Comments: Evidence of urinary incontinence Musculoskeletal:        General: No tenderness. Normal range of motion.     Cervical back: Normal range of motion and neck supple.  Lymphadenopathy:     Cervical: No cervical adenopathy.  Skin:    General: Skin is warm and dry.     Findings: No erythema or rash.  Neurological:     Mental Status: He is alert.     Sensory: Sensory deficit: .milm.     Coordination: Coordination normal.     Comments: Able to move all 4 extremities, he appears confused and slow to perform the activities but is able to do them with symmetrical strength, cranial nerves III through XII are normal  Psychiatric:        Behavior: Behavior normal.     ED Results / Procedures / Treatments   Labs (all labs ordered are listed, but  only abnormal results are displayed) Labs Reviewed  COMPREHENSIVE METABOLIC PANEL - Abnormal; Notable for the following components:      Result Value   Sodium 129 (*)    CO2 19 (*)    All other components within normal limits  RAPID URINE DRUG SCREEN, HOSP PERFORMED - Abnormal; Notable for the following components:   Amphetamines POSITIVE (*)    Tetrahydrocannabinol POSITIVE (*)    All other components within normal limits  CBC WITH DIFFERENTIAL/PLATELET  MAGNESIUM  ETHANOL  CBG MONITORING, ED    EKG EKG Interpretation  Date/Time:  Saturday August 15 2022 20:39:22 EDT Ventricular Rate:  93 PR Interval:  143 QRS Duration: 107 QT Interval:  377 QTC Calculation: 469 R Axis:   83 Text Interpretation: Sinus rhythm RSR' in V1 or V2, probably normal variant Confirmed by Eber Hong ([id number]) on 08/15/2022 10:13:31 PM  Radiology CT Cervical Spine Wo Contrast  Result Date: 08/15/2022 CLINICAL DATA:  Poly trauma, blunt.  Seizures. EXAM: CT CERVICAL SPINE WITHOUT CONTRAST TECHNIQUE: Multidetector CT imaging of the cervical spine was performed without intravenous contrast. Multiplanar CT image reconstructions were also generated. RADIATION DOSE REDUCTION: This exam was performed according to the departmental dose-optimization program which includes automated exposure control, adjustment of the mA and/or kV according to patient size and/or use of iterative reconstruction technique. COMPARISON:  Cervical spine radiographs 12/08/2014 and 12/26/2012. CT cervical spine 11/16/2012 FINDINGS: Alignment: Straightening of usual cervical lordosis without anterior subluxation. This is likely positional but could indicate muscle spasm. Alignment is unchanged since prior studies. Skull base and vertebrae: No acute fracture. No primary bone lesion or focal pathologic process. Soft tissues and spinal canal: No prevertebral fluid or swelling. No visible canal hematoma. Disc levels:  Intervertebral disc space heights  are normal. Upper chest: Lung apices are clear. Other: None. IMPRESSION: Nonspecific straightening of usual cervical lordosis. No acute displaced fractures identified. Electronically Signed   By: Burman Nieves M.D.   On: 08/15/2022 22:53   CT Head Wo Contrast  Result Date: 08/15/2022 CLINICAL DATA:  New onset seizures.  History of trauma. EXAM: CT HEAD WITHOUT CONTRAST TECHNIQUE: Contiguous axial images were obtained from the base of the skull through the vertex without intravenous contrast. RADIATION DOSE REDUCTION: This exam was performed according to the departmental dose-optimization program which includes automated exposure control, adjustment of the mA and/or kV according to patient size and/or  use of iterative reconstruction technique. COMPARISON:  None 05/2012 FINDINGS: Brain: No evidence of acute infarction, hemorrhage, hydrocephalus, extra-axial collection or mass lesion/mass effect. Vascular: No hyperdense vessel or unexpected calcification. Skull: Normal. Negative for fracture or focal lesion. Sinuses/Orbits: Retention cysts in the right maxillary antrum. Mucosal thickening in the paranasal sinuses. No acute air-fluid levels. Opacification of bilateral mastoid air cells. Other: None. IMPRESSION: 1. No acute intracranial abnormalities. 2. Bilateral mastoid effusions. Electronically Signed   By: Burman Nieves M.D.   On: 08/15/2022 22:50    Procedures Procedures    Medications Ordered in ED Medications - No data to display  ED Course/ Medical Decision Making/ A&P                             Medical Decision Making Amount and/or Complexity of Data Reviewed Labs: ordered. Radiology: ordered.    This patient presents to the ED for concern of new onset seizures, this involves an extensive number of treatment options, and is a complaint that carries with it a high risk of complications and morbidity.  The differential diagnosis includes trauma, electrolyte abnormality, hypoglycemia,  alcohol withdrawal   Co morbidities that complicate the patient evaluation  No significant chronic medical problems   Additional history obtained:  Additional history obtained from paramedics External records from outside source obtained and reviewed including reviewed charts, ER visits, office visit for Behcet's, follows with infectious disease for that reason, no other recent evaluations or admissions   Lab Tests:  I Ordered, and personally interpreted labs.  The pertinent results include: Positive for methamphetamine and marijuana in the urine drug screen, labs otherwise unremarkable   Imaging Studies ordered:  I ordered imaging studies including CT scan of the brain and cervical spine I independently visualized and interpreted imaging which showed no acute abnormalities I agree with the radiologist interpretation   Cardiac Monitoring: / EKG:  The patient was maintained on a cardiac monitor.  I personally viewed and interpreted the cardiac monitored which showed an underlying rhythm of: Normal sinus rhythm   Consultations Obtained:  I requested consultation with the neurologist Dr. Jerrell Belfast,  and discussed lab and imaging findings as well as pertinent plan - they recommend: Outpatient follow-up, recommended not using any amphetamines, no need for antiepileptics, driving precautions given   Problem List / ED Course / Critical interventions / Medication management  Patient has not had any further seizures, vital signs normal, he has returned to baseline mental status, he and his significant other at the bedside expressed her understanding to the need to avoid stimulant drugs as well as the legal issues of driving, they understand that they are not allowed to drive a car for 6 months, and they need to follow-up with neurology I have reviewed the patients home medicines and have made adjustments as needed   Social Determinants of Health:  Drug use   Test / Admission -  Considered:  Considered admission but patient had no further seizures, stable for discharge         Final Clinical Impression(s) / ED Diagnoses Final diagnoses:  Seizure (HCC)     Eber Hong, MD 08/15/22 2256

## 2022-08-15 NOTE — ED Triage Notes (Signed)
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 Pt was lying supine on ground, had to be pulled out from under forklift by coworkers, foaming at the mouth, diaphoretic, disoriented/post-Ictal state. Did have episode of urinary incontinence. PERRLA. No other signs of injury, unknown if hit head. Immobilized by ems with c-collar and spine board. Pt took off c-collar. Pt denies hx of seizures but coworkers report pt has hx of seizure. Pt reports had a seizure when he was a child. EMS IV 18g L FA. EMS VS BP 126/67, HR 99, SPO2 98% RA. CBG 116. Pt reports headache, doesn't remember what happened. A&Ox4 at this time.

## 2022-08-15 NOTE — Discharge Instructions (Signed)
SEIZURE PRECAUTIONS °Per [place] DMV statutes, patients with seizures are not allowed to drive until they have been seizure-free for six months. °  °Use caution when using heavy equipment or power tools. Avoid working on ladders or at heights. Take showers instead of baths. Ensure the water temperature is not too high on the home water heater. Do not go swimming alone. Do not lock yourself in a room alone (i.e. bathroom). When caring for infants or small children, sit down when holding, feeding, or changing them to minimize risk of injury to the child in the event you have a seizure. Maintain good sleep hygiene. Avoid alcohol. °  °If patient has another seizure, call 911 and bring them back to the ED if: °A.  The seizure lasts longer than 5 minutes.      °B.  The patient doesn't wake shortly after the seizure or has new problems such as difficulty seeing, speaking or moving following the seizure °C.  The patient was injured during the seizure °D.  The patient has a temperature over 102 F (39C) °E.  The patient vomited during the seizure and now is having trouble breathing ° ° °
